# Patient Record
Sex: Male | Born: 1965 | Race: White | Hispanic: No | Marital: Married
Health system: Southern US, Community
[De-identification: ages and names within clinical notes are randomized; demographics above are authoritative.]

---

## 2004-01-04 ENCOUNTER — Ambulatory Visit (HOSPITAL_COMMUNITY): Admission: RE | Admit: 2004-01-04 | Discharge: 2004-01-04 | Payer: Self-pay | Admitting: Pulmonary Disease

## 2004-04-16 ENCOUNTER — Emergency Department (HOSPITAL_COMMUNITY): Admission: EM | Admit: 2004-04-16 | Discharge: 2004-04-16 | Payer: Self-pay | Admitting: *Deleted

## 2004-05-29 ENCOUNTER — Observation Stay (HOSPITAL_COMMUNITY): Admission: EM | Admit: 2004-05-29 | Discharge: 2004-05-30 | Payer: Self-pay | Admitting: Emergency Medicine
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
## 2005-04-17 IMAGING — CR DG CHEST 1V PORT
1 series · 1 of 1 positions shown · non-contrast
Comparison: 04/16/2004

CLINICAL DATA: Shortness of breath. Asthma.

PORTABLE CHEST - 1 VIEW

[view not recorded]
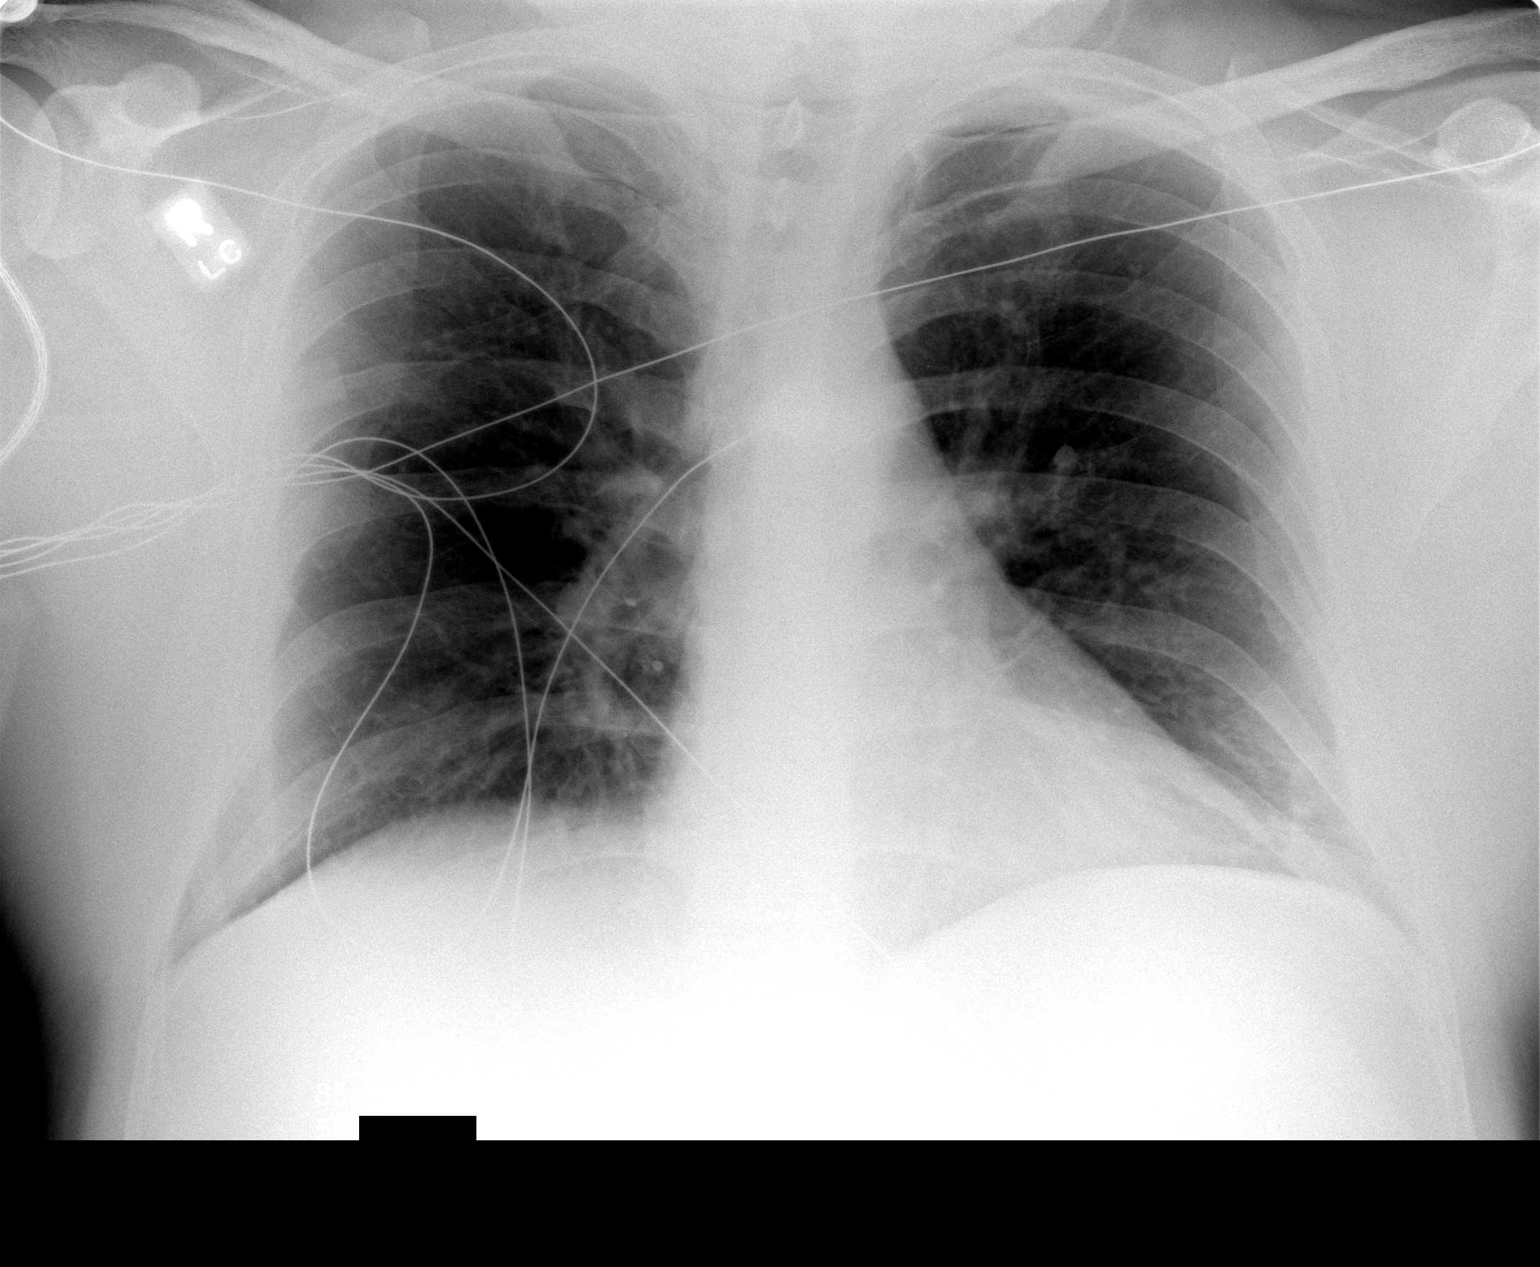

[1 of 1 positions shown; findings below may reference images not displayed]

FINDINGS: The heart size and mediastinal contours are within normal limits.  Both lungs are clear.

IMPRESSION

No acute disease.

## 2019-10-13 ENCOUNTER — Other Ambulatory Visit: Payer: Self-pay

## 2019-10-13 ENCOUNTER — Ambulatory Visit: Payer: Medicare HMO | Admitting: Neurology

## 2019-10-13 DIAGNOSIS — R569 Unspecified convulsions: Secondary | ICD-10-CM | POA: Diagnosis not present

## 2019-10-13 NOTE — Progress Notes (Signed)
   [facility]  EEG (ELECTROENCEPHALOGRAM) REPORT   STUDY DATE: 10/13/2019 PATIENT NAME: Alan Skinner DOB: 03-09-1966 MRN: [id number]  ORDERING CLINICIAN: Emmaline Life  TECHNOLOGIST: Elvis Coil, RPSGT TECHNIQUE: Electroencephalogram was recorded utilizing standard 10-20 system of lead placement and reformatted into average and bipolar montages.  RECORDING TIME: 23 minutes 19 seconds  CLINICAL INFORMATION: 54 year old man with seizures  FINDINGS: A digital EEG was performed while the patient was awake and drowsy. While awake and most alert there was a 10-12 hz posterior dominant rhythm that reacted to eye opening closing. Voltages and frequencies were symmetric.  There were no focal, lateralizing, epileptiform activity or seizures seen.  There was mild slowing with hyperventilation but no lateralization.  Photic stimulation had a normal driving response. Hyperventilation and recovery did not change the underlying rhythms. EKG channel shows normal sinus rhythm with occasional PVCs..  The patient became drowsy but did not fall asleep.  IMPRESSION: This is a normal EEG while the patient was awake and drowsy.  There was no epileptiform activity noted.  Occasional PVCs were noted on EKG.   INTERPRETING PHYSICIAN:   Takasha Vetere A. Epimenio Foot, MD, PhD, [facility] Certified in Neurology, Clinical Neurophysiology, Sleep Medicine, Pain Medicine and Neuroimaging  [facility] [address] [place], [place] [address] [phone]
